# Patient Record
Sex: Male | Born: 2000 | Hispanic: No | Marital: Single | Smoking: Never smoker
Health system: Southern US, Community
[De-identification: ages and names within clinical notes are randomized; demographics above are authoritative.]

## PROBLEM LIST (undated history)

## (undated) HISTORY — PX: SINUS EXPLORATION: SHX5214

---
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
## 2021-04-22 ENCOUNTER — Emergency Department (HOSPITAL_BASED_OUTPATIENT_CLINIC_OR_DEPARTMENT_OTHER)
Admission: EM | Admit: 2021-04-22 | Discharge: 2021-04-23 | Disposition: A | Discharge status: Home / Self Care | Payer: Self-pay | Attending: Emergency Medicine | Admitting: Emergency Medicine

## 2021-04-22 ENCOUNTER — Encounter (HOSPITAL_BASED_OUTPATIENT_CLINIC_OR_DEPARTMENT_OTHER): Payer: Self-pay | Admitting: *Deleted

## 2021-04-22 ENCOUNTER — Other Ambulatory Visit: Payer: Self-pay

## 2021-04-22 DIAGNOSIS — Z79899 Other long term (current) drug therapy: Secondary | ICD-10-CM | POA: Insufficient documentation

## 2021-04-22 DIAGNOSIS — S61012A Laceration without foreign body of left thumb without damage to nail, initial encounter: Secondary | ICD-10-CM | POA: Insufficient documentation

## 2021-04-22 DIAGNOSIS — W260XXA Contact with knife, initial encounter: Secondary | ICD-10-CM | POA: Insufficient documentation

## 2021-04-22 DIAGNOSIS — Y990 Civilian activity done for income or pay: Secondary | ICD-10-CM | POA: Insufficient documentation

## 2021-04-22 MED ORDER — LIDOCAINE HCL 2 % IJ SOLN
10.0000 mL | Freq: Once | INTRAMUSCULAR | Status: AC
  Administered 2021-04-23: 200 mg
  Filled 2021-04-22: qty 20
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
## 2021-04-23 MED ORDER — SULFAMETHOXAZOLE-TRIMETHOPRIM 800-160 MG PO TABS: 1.0000 | ORAL_TABLET | Freq: Two times a day (BID) | ORAL | 0 refills | Status: AC

## 2021-04-23 NOTE — ED Provider Notes (Addendum)
[facility] Provider Note   CSN: [id number] Arrival date & time: 04/22/21  2103     History  Chief Complaint  Patient presents with   Extremity Laceration    Carl Patrick is a 21 y.o. male who presents to the emergency department for a thumb laceration occurring 5 hours prior to ER arrival.  Patient states he cut it with a knife while at work, bandaged it and drove home.  HPI     Home Medications Prior to Admission medications   Medication Sig Start Date End Date Taking? Authorizing Provider  sulfamethoxazole-trimethoprim (BACTRIM DS) 800-160 MG tablet Take 1 tablet by mouth 2 (two) times daily for 7 days. 04/23/21 04/30/21 Yes Teryl Mcconaghy T, PA-C  acetaminophen-codeine (TYLENOL #3) 300-30 MG per tablet Take 1-2 tablets by mouth every 6 (six) hours as needed. 12/29/13   Varney Biles, MD  ibuprofen (ADVIL,MOTRIN) 400 MG tablet Take 1 tablet (400 mg total) by mouth every 6 (six) hours as needed. 12/29/13   Varney Biles, MD      Allergies    Penicillins    Review of Systems   Review of Systems  Skin:        Thumb laceration  All other systems reviewed and are negative.  Physical Exam Updated Vital Signs BP (!) 123/91 (BP Location: Right Arm)    Pulse 79    Temp 98 F (36.7 C) (Oral)    Resp 14    Ht 6\' 3"  (1.905 m)    Wt 81.6 kg    SpO2 98%    BMI 22.50 kg/m  Physical Exam Vitals and nursing note reviewed.  Constitutional:      Appearance: Normal appearance.  HENT:     Head: Normocephalic and atraumatic.  Eyes:     Conjunctiva/sclera: Conjunctivae normal.  Pulmonary:     Effort: Pulmonary effort is normal. No respiratory distress.  Musculoskeletal:     Comments: Full range of motion of all digits of the left hand.  Skin:    General: Skin is warm and dry.     Comments: Approximately 3 cm laceration to the palmar aspect of the left thumb.  Good capillary refill.  Neurological:     Mental Status: He is alert.  Psychiatric:         Mood and Affect: Mood normal.        Behavior: Behavior normal.    ED Results / Procedures / Treatments   Labs (all labs ordered are listed, but only abnormal results are displayed) Labs Reviewed - No data to display  EKG None  Radiology No results found.  Procedures .Marland KitchenLaceration Repair  Date/Time: 04/23/2021 12:23 AM Performed by: Kateri Plummer, PA-C Authorized by: Kateri Plummer, PA-C   Consent:    Consent obtained:  Verbal   Consent given by:  Patient   Risks discussed:  Infection, need for additional repair, pain, poor cosmetic result and poor wound healing   Alternatives discussed:  No treatment and delayed treatment Universal protocol:    Procedure explained and questions answered to patient or proxy's satisfaction: yes     Relevant documents present and verified: yes     Test results available: yes     Imaging studies available: yes     Required blood products, implants, devices, and special equipment available: yes     Site/side marked: yes     Immediately prior to procedure, a time out was called: yes     Patient identity confirmed:  Verbally with patient Anesthesia:    Anesthesia method:  Local infiltration and nerve block   Local anesthetic:  Lidocaine 1% w/o epi   Block location:  Digital   Block needle gauge:  25 G   Block anesthetic:  Lidocaine 1% w/o epi Laceration details:    Location:  Finger   Finger location:  L thumb   Length (cm):  3 Pre-procedure details:    Preparation:  Patient was prepped and draped in usual sterile fashion Exploration:    Hemostasis achieved with:  Tourniquet   Wound exploration: wound explored through full range of motion   Treatment:    Irrigation solution:  Sterile water   Irrigation method:  Pressure wash Skin repair:    Repair method:  Sutures   Suture size:  5-0   Suture material:  Nylon   Suture technique:  Simple interrupted   Number of sutures:  5 Approximation:    Approximation:   Close Repair type:    Repair type:  Simple Post-procedure details:    Dressing:  Sterile dressing   Procedure completion:  Tolerated well, no immediate complications .Nerve Block  Date/Time: 04/23/2021 12:25 AM Performed by: Kateri Plummer, PA-C Authorized by: Kateri Plummer, PA-C   Consent:    Consent obtained:  Verbal   Consent given by:  Patient   Risks, benefits, and alternatives were discussed: yes     Risks discussed:  Unsuccessful block Universal protocol:    Procedure explained and questions answered to patient or proxy's satisfaction: yes     Patient identity confirmed:  Verbally with patient Indications:    Indications:  Pain relief Location:    Body area:  Upper extremity   Upper extremity nerve blocked: digital.   Laterality: left thumb. Pre-procedure details:    Skin preparation:  Alcohol Skin anesthesia:    Skin anesthesia method:  Local infiltration   Local anesthetic:  Lidocaine 1% w/o epi Procedure details:    Block needle gauge:  25 G   Anesthetic injected:  Lidocaine 1% w/o epi   Injection procedure:  Anatomic landmarks palpated Post-procedure details:    Outcome:  Anesthesia achieved   Procedure completion:  Tolerated well, no immediate complications    Medications Ordered in ED Medications  lidocaine (XYLOCAINE) 2 % (with pres) injection 200 mg (has no administration in time range)    ED Course/ Medical Decision Making/ A&P                           Medical Decision Making Risk Prescription drug management.  Patient is an otherwise healthy 21 year old male who presents to the emergency department for a left thumb laceration occurring 5 hours prior to ER arrival.  On exam patient has an approximately 3 cm laceration to the palmar aspect of the left thumb.  He has full range of motion of all digits of that hand, with good sensation and capillary refill.  Pressure irrigation performed. Wound explored and base of wound visualized in a  bloodless field without evidence of foreign body.  Laceration occurred < 8 hours prior to repair which was well tolerated. Tdap up to date.  Pt has no comorbidities to effect normal wound healing. Pt discharged with antibiotics.  Discussed suture home care with patient and answered questions. Pt to follow-up for wound check and suture removal in 7 days; they are to return to the ED sooner for signs of infection. Pt is hemodynamically stable with no  complaints prior to dc.    Final Clinical Impression(s) / ED Diagnoses Final diagnoses:  Laceration of left thumb without foreign body without damage to nail, initial encounter    Rx / DC Orders ED Discharge Orders          Ordered    sulfamethoxazole-trimethoprim (BACTRIM DS) 800-160 MG tablet  2 times daily        04/23/21 0007           Portions of this report may have been transcribed using voice recognition software. Every effort was made to ensure accuracy; however, inadvertent computerized transcription errors may be present.    Kateri Plummer, PA-C 04/23/21 0019    Tyffani Foglesong T, PA-C 04/23/21 [id number]    Gareth Morgan, MD 04/23/21 1021

## 2021-04-23 NOTE — Discharge Instructions (Addendum)
You are seen in the emergency department today for laceration.  We have stitched this closed and bandaged it.  I am putting you on antibiotics for the next 7 days to prevent infection.  You can take ibuprofen or Tylenol as needed for pain.  I want you to have the sutures removed in 10 days.  You can have this done at any doctor's office, urgent care, emergency department.

## 2022-08-16 ENCOUNTER — Ambulatory Visit: Payer: Commercial Managed Care - PPO | Admitting: Internal Medicine

## 2023-07-25 ENCOUNTER — Ambulatory Visit: Payer: Commercial Managed Care - PPO | Admitting: Dermatology

## 2024-01-10 ENCOUNTER — Ambulatory Visit: Admitting: Physician Assistant
